# Patient Record
Sex: Female | Born: 2000 | Race: Black or African American | Hispanic: No | Marital: Single | Smoking: Never smoker
Health system: Southern US, Community
[De-identification: ages and names within clinical notes are randomized; demographics above are authoritative.]

## PROBLEM LIST (undated history)

## (undated) DIAGNOSIS — L309 Dermatitis, unspecified: Secondary | ICD-10-CM

## (undated) DIAGNOSIS — R1010 Upper abdominal pain, unspecified: Secondary | ICD-10-CM

## (undated) HISTORY — DX: Upper abdominal pain, unspecified: R10.10

## (undated) HISTORY — DX: Dermatitis, unspecified: L30.9

---
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
## 2021-03-25 ENCOUNTER — Other Ambulatory Visit: Payer: Self-pay

## 2021-03-25 ENCOUNTER — Ambulatory Visit (INDEPENDENT_AMBULATORY_CARE_PROVIDER_SITE_OTHER): Payer: Self-pay | Admitting: Nurse Practitioner

## 2021-03-25 ENCOUNTER — Encounter: Payer: Self-pay | Admitting: Nurse Practitioner

## 2021-03-25 VITALS — BP 108/68 | HR 90 | Temp 97.4°F | Ht 65.56 in | Wt 140.0 lb

## 2021-03-25 DIAGNOSIS — Z0001 Encounter for general adult medical examination with abnormal findings: Secondary | ICD-10-CM

## 2021-03-25 DIAGNOSIS — Z0000 Encounter for general adult medical examination without abnormal findings: Secondary | ICD-10-CM

## 2021-03-25 DIAGNOSIS — R42 Dizziness and giddiness: Secondary | ICD-10-CM

## 2021-03-25 DIAGNOSIS — N6311 Unspecified lump in the right breast, upper outer quadrant: Secondary | ICD-10-CM

## 2021-03-25 NOTE — Progress Notes (Signed)
Subjective:    Patient ID: Melissa Beard, female    DOB: [birth year], 21 y.o.   MRN: [id number]  HPI The patient comes in today for a wellness visit. Patient here with her mother. 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 Doing well in school. Is currently a Printmaker.   A review of their health history was completed.  A review of medications was also completed.  Any needed refills; no  Eating habits: Like to eat out a lot. Does not consume a lot of vegetables. Patient admits that diet could be better  Falls/  MVA accidents in past few months: no  Regular exercise: not much, admits that she should exercise more. Used to play sports in high school but now that she is in college she does not exercise as much as she should.   Specialist pt sees on regular basis: no  Preventative health issues were discussed.  - Immunizations UTD - Not sexually active, HIV testing not indicated at this time.  - COVID not UTD - Contraception not indicated at this since patient is not sexually active - No illicit drug use - No ETOH use  Additional concerns: R breast bump and tenderness first noticed about 2 weeks ago. Patient states that bump has decreased in sized and is no longer painful. Mother states that there is no hx of breast CA in the family, however, fibroadenoma was found in patient's older sister around the time she was 43 years old. Mother concerned that this bump may also be fibroadenoma.   Patient also has concerns where she sometimes feels dizzy when she gets up too fast. Patient also mentions sometimes feeling cold and that her menstrual cycle is longer in duration and sometimes comes sooner (~3-4 days early). Patient denies headache, changes in weight, loss of consciousness, palpitations, fever, chills.    Review of Systems  Constitutional: Negative.   HENT: Negative.    Eyes: Negative.   Respiratory: Negative.    Cardiovascular: Negative.   Gastrointestinal: Negative.    Endocrine: Positive for cold intolerance.  Genitourinary:  Positive for menstrual problem.  Musculoskeletal: Negative.   Skin: Negative.   Allergic/Immunologic: Negative.   Neurological:  Positive for dizziness.  Hematological: Negative.   Psychiatric/Behavioral: Negative.        Objective:   Physical Exam Exam conducted with a chaperone present.  Constitutional:      General: She is not in acute distress.    Appearance: Normal appearance. She is obese. She is not ill-appearing or toxic-appearing.  HENT:     Head: Normocephalic.  Cardiovascular:     Rate and Rhythm: Normal rate and regular rhythm.     Pulses: Normal pulses.     Heart sounds: Normal heart sounds. No murmur heard. Pulmonary:     Effort: Pulmonary effort is normal. No respiratory distress.     Breath sounds: Normal breath sounds. No wheezing.  Chest:  Breasts:    Breasts are symmetrical.     Right: Mass present. No swelling, bleeding, inverted nipple, nipple discharge, skin change or tenderness.     Left: Normal. No swelling, bleeding, inverted nipple, mass, nipple discharge, skin change or tenderness.    Abdominal:     General: Abdomen is flat.  Musculoskeletal:     Cervical back: Normal range of motion.  Lymphadenopathy:     Upper Body:     Right upper body: No supraclavicular, axillary or pectoral adenopathy.     Left upper body: No supraclavicular, axillary or  pectoral adenopathy.  Skin:    General: Skin is warm.  Neurological:     General: No focal deficit present.     Mental Status: She is alert and oriented to person, place, and time.  Psychiatric:        Mood and Affect: Mood normal.        Behavior: Behavior normal.          Assessment & Plan:   Wellness visit - Adult wellness-complete.wellness physical was conducted today. Importance of diet and exercise were discussed in detail.  In addition to this a discussion regarding safety was also covered. We also reviewed over immunizations  and gave recommendations regarding current immunization needed for age.  In addition to this additional areas were also touched on including: -Preventative health exams needed:  Immunizations UTD  Not sexually active, HIV testing not indicated at this time.   COVID not UTD  Contraception not indicated at this since patient is not sexually active. Patient will RTC if she feels contraception is needed in the future  Denies illicit drug use  Denies ETOH use - RTC in 1 year for yearly exam or sooner if needed.   2. Mass of upper outer quadrant of right breast - Like a benign cyst such as fibrocystic changes or a fibroadenoma - Risk factors for breast CA reviewed. Risk factors low. No first degree relative with breast CA. Low suspicion for malignancy.  - US BREAST LTD UNI RIGHT INC AXILLA to confirm or r/o fibrocystic changes and/or fibroadenoma  3. Orthostatic dizziness - Possible dehydration. But also consider anemia or thyroid etiologies. - CBC with Differential - TSH + free T4

## 2021-03-30 ENCOUNTER — Ambulatory Visit (HOSPITAL_COMMUNITY): Payer: Self-pay

## 2021-04-06 ENCOUNTER — Other Ambulatory Visit (HOSPITAL_COMMUNITY): Payer: Self-pay | Admitting: Nurse Practitioner

## 2021-04-06 ENCOUNTER — Ambulatory Visit (HOSPITAL_COMMUNITY)
Admission: RE | Admit: 2021-04-06 | Discharge: 2021-04-06 | Disposition: A | Discharge status: Home / Self Care | Payer: Self-pay | Source: Ambulatory Visit | Attending: Nurse Practitioner | Admitting: Nurse Practitioner

## 2021-04-06 ENCOUNTER — Other Ambulatory Visit: Payer: Self-pay

## 2021-04-06 DIAGNOSIS — N6310 Unspecified lump in the right breast, unspecified quadrant: Secondary | ICD-10-CM

## 2021-04-06 DIAGNOSIS — N6311 Unspecified lump in the right breast, upper outer quadrant: Secondary | ICD-10-CM | POA: Insufficient documentation

## 2021-04-08 ENCOUNTER — Ambulatory Visit (HOSPITAL_COMMUNITY)
Admission: RE | Admit: 2021-04-08 | Discharge: 2021-04-08 | Disposition: A | Discharge status: Home / Self Care | Payer: BC Managed Care – PPO | Source: Ambulatory Visit | Attending: Nurse Practitioner | Admitting: Nurse Practitioner

## 2021-04-08 ENCOUNTER — Other Ambulatory Visit: Payer: Self-pay

## 2021-04-08 ENCOUNTER — Encounter (HOSPITAL_COMMUNITY): Payer: Self-pay

## 2021-04-08 DIAGNOSIS — N6310 Unspecified lump in the right breast, unspecified quadrant: Secondary | ICD-10-CM | POA: Insufficient documentation

## 2021-04-08 DIAGNOSIS — D241 Benign neoplasm of right breast: Secondary | ICD-10-CM | POA: Diagnosis not present

## 2021-04-08 MED ORDER — LIDOCAINE-EPINEPHRINE (PF) 1 %-1:200000 IJ SOLN
INTRAMUSCULAR | Status: AC
  Administered 2021-04-08: 30 mL
  Filled 2021-04-08: qty 30

## 2021-04-08 MED ORDER — LIDOCAINE HCL (PF) 1 % IJ SOLN
INTRAMUSCULAR | Status: AC
  Administered 2021-04-08: 5 mL
  Filled 2021-04-08: qty 5

## 2021-04-08 NOTE — Progress Notes (Signed)
PT tolerated right breast biopsy well today with NAD noted. Labs were collected and sent to lab at this time for processing. PT verbalized understanding of discharge instructions and ambulatory at discharge and left with her mother.

## 2021-04-09 LAB — SURGICAL PATHOLOGY

## 2021-04-09 NOTE — Progress Notes (Signed)
Spoke with mother of patient who verified patient name and DOB. Notified mother of result. Mother stated understanding.

## 2022-05-06 ENCOUNTER — Ambulatory Visit (INDEPENDENT_AMBULATORY_CARE_PROVIDER_SITE_OTHER): Payer: BC Managed Care – PPO | Admitting: Nurse Practitioner

## 2022-05-06 VITALS — BP 101/68 | HR 91 | Temp 98.5°F | Wt 145.0 lb

## 2022-05-06 DIAGNOSIS — N6311 Unspecified lump in the right breast, upper outer quadrant: Secondary | ICD-10-CM

## 2022-05-06 DIAGNOSIS — H9202 Otalgia, left ear: Secondary | ICD-10-CM | POA: Diagnosis not present

## 2022-05-06 DIAGNOSIS — J300 Vasomotor rhinitis: Secondary | ICD-10-CM | POA: Diagnosis not present

## 2022-05-06 MED ORDER — CEPHALEXIN 500 MG PO CAPS: 500.0000 mg | ORAL_CAPSULE | Freq: Three times a day (TID) | ORAL | 0 refills | Status: AC

## 2022-05-06 NOTE — Progress Notes (Unsigned)
   Subjective:    Patient ID: Melissa Beard, female    DOB: 2001-02-23, 22 y.o.   MRN: [id number]  HPI Patient arrives today arrives with left ear pain since December. The issue started as the feeling of "clogged ears or being underwater" in December. No recent respiratory illness before this. Three days ago, her left ear started to be painful, and she was unable to sleep on that left side. Patient states it was also very swollen, and there was a small lesion on the outer part of her ear. The patient's mother drained the lesion, and clear drainage came out. It has improved since it was drained, and does not hurt as bad. Patient reports that it is sore in the pre-auricular region. She has tried Motrin, Tylenol, and some OTC ear drops for pain. She did have some relief. Reports no fever.   Patient also has concerns with lump in right breast. She has already been worked up for this in the past, and they placed a marker in the right breast where the lump is located. Patient was concerned and wanted to see about getting the lump completely removed.    Review of Systems  Constitutional:  Negative for fatigue and fever.  HENT:  Positive for ear pain. Negative for ear discharge, rhinorrhea, sinus pressure, sinus pain, sore throat and trouble swallowing.   Respiratory:  Negative for cough, shortness of breath and wheezing.   Psychiatric/Behavioral:  Positive for sleep disturbance.       Objective:   Physical Exam Vitals and nursing note reviewed. Exam conducted with a chaperone present.  Constitutional:      Appearance: Normal appearance. She is not ill-appearing.  HENT:     Right Ear: External ear normal. No swelling. Tympanic membrane is not erythematous or retracted.     Left Ear: External ear normal. No swelling. Tympanic membrane is retracted. Tympanic membrane is not erythematous.     Nose:     Comments: L nostril is boggy and pale.     Mouth/Throat:     Pharynx: No posterior oropharyngeal  erythema.  Cardiovascular:     Heart sounds: Normal heart sounds.  Pulmonary:     Effort: No respiratory distress.     Breath sounds: Normal breath sounds. No wheezing.  Chest:  Breasts:    Right: Normal. No inverted nipple or mass.     Left: Normal. No inverted nipple, mass or skin change.     Comments: No nodularities assessed in left or right breast.  Lymphadenopathy:     Cervical: Cervical adenopathy present.  Neurological:     Mental Status: She is alert.    Vitals:   05/06/22 0902  BP: 101/68  Pulse: 91  Temp: 98.5 F (36.9 C)  Weight: 145 lb (65.8 kg)  SpO2: 100%  TempSrc: Oral       Assessment & Plan:   1. Left ear pain Ear pain is resolving on its own. Put antibiotics on hold at the pharmacy, and educated the patient to fill it if she has any swelling of the ear or ear pain.  -Educated patient to use Nasacort OTC for symptoms  2. Vasomotor rhinitis Educated patient to use Nasacort OTC for symptoms.   3. Mass of upper outer quadrant of right breast Educated patient to follow up around March 2024 for referral to general surgery to schedule for lump removal.    Return if symptoms worsen or fail to improve.

## 2022-05-06 NOTE — Progress Notes (Unsigned)
   Subjective:    Patient ID: Melissa Beard, female    DOB: Sep 24, 2000, 22 y.o.   MRN: [id number]  HPI Patient arrives today arrives with left ear pain since December.     Review of Systems     Objective:   Physical Exam        Assessment & Plan:

## 2022-05-07 ENCOUNTER — Encounter: Payer: Self-pay | Admitting: Nurse Practitioner

## 2022-05-07 DIAGNOSIS — J300 Vasomotor rhinitis: Secondary | ICD-10-CM | POA: Insufficient documentation

## 2022-10-26 NOTE — Progress Notes (Signed)
This encounter was created in error - please disregard.

## 2024-05-01 ENCOUNTER — Encounter: Admitting: Family Medicine
